# Patient Record
Sex: Female | Born: 1955 | Race: White | Hispanic: No | Marital: Married | Smoking: Never smoker
Health system: Southern US, Community
[De-identification: ages and names within clinical notes are randomized; demographics above are authoritative.]

## PROBLEM LIST (undated history)

## (undated) DIAGNOSIS — Z789 Other specified health status: Secondary | ICD-10-CM

## (undated) DIAGNOSIS — M810 Age-related osteoporosis without current pathological fracture: Secondary | ICD-10-CM

## (undated) DIAGNOSIS — I10 Essential (primary) hypertension: Secondary | ICD-10-CM

## (undated) HISTORY — PX: NO PAST SURGERIES: SHX2092

## (undated) HISTORY — PX: TUBAL LIGATION: SHX77

## (undated) HISTORY — DX: Other specified health status: Z78.9

---

## 2006-06-20 ENCOUNTER — Ambulatory Visit: Payer: Self-pay | Admitting: Family Medicine

## 2006-06-20 DIAGNOSIS — H539 Unspecified visual disturbance: Secondary | ICD-10-CM | POA: Insufficient documentation

## 2006-06-20 DIAGNOSIS — R209 Unspecified disturbances of skin sensation: Secondary | ICD-10-CM | POA: Insufficient documentation

## 2006-06-22 ENCOUNTER — Encounter: Payer: Self-pay | Admitting: Family Medicine

## 2006-07-08 ENCOUNTER — Ambulatory Visit: Payer: Self-pay | Admitting: Family Medicine

## 2006-07-08 DIAGNOSIS — G43809 Other migraine, not intractable, without status migrainosus: Secondary | ICD-10-CM | POA: Insufficient documentation

## 2006-07-08 DIAGNOSIS — M722 Plantar fascial fibromatosis: Secondary | ICD-10-CM

## 2006-07-25 ENCOUNTER — Encounter: Payer: Self-pay | Admitting: Family Medicine

## 2006-07-25 DIAGNOSIS — M899 Disorder of bone, unspecified: Secondary | ICD-10-CM | POA: Insufficient documentation

## 2006-07-25 DIAGNOSIS — M949 Disorder of cartilage, unspecified: Secondary | ICD-10-CM

## 2006-10-06 ENCOUNTER — Ambulatory Visit: Payer: Self-pay | Admitting: Family Medicine

## 2006-10-17 ENCOUNTER — Encounter: Admission: RE | Admit: 2006-10-17 | Discharge: 2006-10-17 | Payer: Self-pay | Admitting: Family Medicine

## 2006-10-18 ENCOUNTER — Telehealth (INDEPENDENT_AMBULATORY_CARE_PROVIDER_SITE_OTHER): Payer: Self-pay | Admitting: *Deleted

## 2006-11-09 ENCOUNTER — Encounter: Payer: Self-pay | Admitting: Family Medicine

## 2007-10-23 ENCOUNTER — Ambulatory Visit: Payer: Self-pay | Admitting: Family Medicine

## 2007-10-23 DIAGNOSIS — R42 Dizziness and giddiness: Secondary | ICD-10-CM

## 2018-06-13 ENCOUNTER — Encounter: Payer: Self-pay | Admitting: Sports Medicine

## 2018-06-13 ENCOUNTER — Ambulatory Visit (INDEPENDENT_AMBULATORY_CARE_PROVIDER_SITE_OTHER): Payer: 59

## 2018-06-13 ENCOUNTER — Ambulatory Visit (INDEPENDENT_AMBULATORY_CARE_PROVIDER_SITE_OTHER): Payer: 59 | Admitting: Sports Medicine

## 2018-06-13 DIAGNOSIS — M7061 Trochanteric bursitis, right hip: Secondary | ICD-10-CM

## 2018-06-13 DIAGNOSIS — G8929 Other chronic pain: Secondary | ICD-10-CM | POA: Diagnosis not present

## 2018-06-13 DIAGNOSIS — M79672 Pain in left foot: Secondary | ICD-10-CM

## 2018-06-13 DIAGNOSIS — M25561 Pain in right knee: Secondary | ICD-10-CM

## 2018-06-13 MED ORDER — MELOXICAM 15 MG PO TABS: ORAL_TABLET | ORAL | 3 refills | Status: DC

## 2018-06-13 NOTE — Assessment & Plan Note (Signed)
I do suspect a third metatarsal neck stress injury. I am going to refer her out for custom orthotics.

## 2018-06-13 NOTE — Progress Notes (Signed)
Subjective:    CC: Multiple pains  HPI:  Hip pain: Right-sided, present several months, localized over the greater trochanter.  Localized without radiation, no trauma.  Left foot pain: Dorsal forefoot pain, localized specifically over the third metatarsal neck.  No trauma.  Present for years.  He does have some three-quarter length rigid plastic orthotics.  Right knee pain: Medial joint line, moderate, persistent, no mechanical symptoms, no trauma, localized without radiation.  I reviewed the past medical history, family history, social history, surgical history, and allergies today and no changes were needed.  Please see the problem list section below in epic for further details.  Past Medical History: Past Medical History:  Diagnosis Date  . No pertinent past medical history    Past Surgical History: Past Surgical History:  Procedure Laterality Date  . NO PAST SURGERIES     Social History: Social History   Socioeconomic History  . Marital status: Married    Spouse name: Not on file  . Number of children: Not on file  . Years of education: Not on file  . Highest education level: Not on file  Occupational History  . Not on file  Social Needs  . Financial resource strain: Not on file  . Food insecurity:    Worry: Not on file    Inability: Not on file  . Transportation needs:    Medical: Not on file    Non-medical: Not on file  Tobacco Use  . Smoking status: Never Smoker  . Smokeless tobacco: Never Used  Substance and Sexual Activity  . Alcohol use: Never    Frequency: Never  . Drug use: Never  . Sexual activity: Not on file  Lifestyle  . Physical activity:    Days per week: Not on file    Minutes per session: Not on file  . Stress: Not on file  Relationships  . Social connections:    Talks on phone: Not on file    Gets together: Not on file    Attends religious service: Not on file    Active member of club or organization: Not on file    Attends meetings  of clubs or organizations: Not on file    Relationship status: Not on file  Other Topics Concern  . Not on file  Social History Narrative  . Not on file   Family History: Family History  Problem Relation Age of Onset  . Cancer Maternal Grandfather        colon   Allergies: No Known Allergies Medications: See med rec.  Review of Systems: No headache, visual changes, nausea, vomiting, diarrhea, constipation, dizziness, abdominal pain, skin rash, fevers, chills, night sweats, swollen lymph nodes, weight loss, chest pain, body aches, joint swelling, muscle aches, shortness of breath, mood changes, visual or auditory hallucinations.  Objective:    General: Well Developed, well nourished, and in no acute distress.  Neuro: Alert and oriented x3, extra-ocular muscles intact, sensation grossly intact.  HEENT: Normocephalic, atraumatic, pupils equal round reactive to light, neck supple, no masses, no lymphadenopathy, thyroid nonpalpable.  Skin: Warm and dry, no rashes noted.  Cardiac: Regular rate and rhythm, no murmurs rubs or gallops.  Respiratory: Clear to auscultation bilaterally. Not using accessory muscles, speaking in full sentences.  Abdominal: Soft, nontender, nondistended, positive bowel sounds, no masses, no organomegaly.  Right hip: ROM IR: 60 Deg, ER: 60 Deg, Flexion: 120 Deg, Extension: 100 Deg, Abduction: 45 Deg, Adduction: 45 Deg Strength IR: 5/5, ER: 5/5, Flexion: 5/5,  Extension: 5/5, Abduction: 5/5, Adduction: 5/5 Pelvic alignment unremarkable to inspection and palpation. Standing hip rotation and gait without trendelenburg / unsteadiness. Greater trochanter with mild tenderness to palpation. No tenderness over piriformis. No SI joint tenderness and normal minimal SI movement. Right knee: Normal to inspection with no erythema or effusion or obvious bony abnormalities. Palpation normal with no warmth or joint line tenderness or patellar tenderness or condyle  tenderness. ROM normal in flexion and extension and lower leg rotation. Ligaments with solid consistent endpoints including ACL, PCL, LCL, MCL. Negative Mcmurray's and provocative meniscal tests. Non painful patellar compression. Patellar and quadriceps tendons unremarkable. Hamstring and quadriceps strength is normal. Left foot: No visible erythema or swelling. Range of motion is full in all directions. Strength is 5/5 in all directions. No hallux valgus. Pes cavus No abnormal callus noted. No pain over the navicular prominence, or base of fifth metatarsal. No tenderness to palpation of the calcaneal insertion of plantar fascia. No pain at the Achilles insertion. No pain over the calcaneal bursa. No pain of the retrocalcaneal bursa. Mild tenderness over the third metatarsal neck No hallux rigidus or limitus. No tenderness palpation over interphalangeal joints. No pain with compression of the metatarsal heads. Neurovascularly intact distally.  Impression and Recommendations:    The patient was counselled, risk factors were discussed, anticipatory guidance given.  Right knee pain Suspect early osteoarthritis. Meloxicam, x-rays, rehab exercises given. Return to see me in a month.  Greater trochanteric bursitis of right hip Rehab exercises given, meloxicam, x-rays.  Chronic foot pain, left I do suspect a third metatarsal neck stress injury. I am going to refer her out for custom orthotics. ___________________________________________ Ihor Austin. Benjamin Stain, M.D., ABFM., CAQSM. Primary Care and Sports Medicine [facility]  Adjunct Professor of Family Medicine  [employer]

## 2018-06-13 NOTE — Assessment & Plan Note (Signed)
Rehab exercises given, meloxicam, x-rays.

## 2018-06-13 NOTE — Assessment & Plan Note (Signed)
Suspect early osteoarthritis. Meloxicam, x-rays, rehab exercises given. Return to see me in a month.

## 2018-07-13 ENCOUNTER — Ambulatory Visit: Payer: 59 | Admitting: Sports Medicine

## 2018-09-26 ENCOUNTER — Other Ambulatory Visit: Payer: Self-pay

## 2018-09-26 ENCOUNTER — Ambulatory Visit (INDEPENDENT_AMBULATORY_CARE_PROVIDER_SITE_OTHER): Payer: 59 | Admitting: Sports Medicine

## 2018-09-26 DIAGNOSIS — M79672 Pain in left foot: Secondary | ICD-10-CM | POA: Diagnosis not present

## 2018-09-26 DIAGNOSIS — M7061 Trochanteric bursitis, right hip: Secondary | ICD-10-CM

## 2018-09-26 DIAGNOSIS — G8929 Other chronic pain: Secondary | ICD-10-CM

## 2018-09-26 DIAGNOSIS — M25561 Pain in right knee: Secondary | ICD-10-CM

## 2018-09-26 DIAGNOSIS — I10 Essential (primary) hypertension: Secondary | ICD-10-CM

## 2018-09-26 DIAGNOSIS — M722 Plantar fascial fibromatosis: Secondary | ICD-10-CM

## 2018-09-26 DIAGNOSIS — M7711 Lateral epicondylitis, right elbow: Secondary | ICD-10-CM

## 2018-09-26 MED ORDER — MELOXICAM 15 MG PO TABS: ORAL_TABLET | ORAL | 3 refills | Status: DC

## 2018-09-26 NOTE — Assessment & Plan Note (Signed)
Needs to get more diligent with her rehab exercises.

## 2018-09-26 NOTE — Assessment & Plan Note (Signed)
Still awaiting her full-length custom orthotics. Adding rehab exercises, meloxicam. Return in 1 month, injection if no better.

## 2018-09-26 NOTE — Progress Notes (Signed)
Subjective:    CC: Multiple issues  HPI: Knee pain: Suspected mild osteoarthritis, never took her meloxicam, still having a bit of discomfort.  Right elbow pain: Localized over the lateral epicondyle, worse with gripping motions, never took the anti-inflammatory, moderate, persistent, localized without radiation.  Right heel pain: Worse on the plantar aspect of the heel, and with the first few steps in the morning.  Hip pain: Trochanteric bursitis, never took the meloxicam.  I reviewed the past medical history, family history, social history, surgical history, and allergies today and no changes were needed.  Please see the problem list section below in epic for further details.  Past Medical History: Past Medical History:  Diagnosis Date  . No pertinent past medical history    Past Surgical History: Past Surgical History:  Procedure Laterality Date  . NO PAST SURGERIES     Social History: Social History   Socioeconomic History  . Marital status: Married    Spouse name: Not on file  . Number of children: Not on file  . Years of education: Not on file  . Highest education level: Not on file  Occupational History  . Not on file  Social Needs  . Financial resource strain: Not on file  . Food insecurity:    Worry: Not on file    Inability: Not on file  . Transportation needs:    Medical: Not on file    Non-medical: Not on file  Tobacco Use  . Smoking status: Never Smoker  . Smokeless tobacco: Never Used  Substance and Sexual Activity  . Alcohol use: Never    Frequency: Never  . Drug use: Never  . Sexual activity: Not on file  Lifestyle  . Physical activity:    Days per week: Not on file    Minutes per session: Not on file  . Stress: Not on file  Relationships  . Social connections:    Talks on phone: Not on file    Gets together: Not on file    Attends religious service: Not on file    Active member of club or organization: Not on file    Attends meetings  of clubs or organizations: Not on file    Relationship status: Not on file  Other Topics Concern  . Not on file  Social History Narrative  . Not on file   Family History: Family History  Problem Relation Age of Onset  . Cancer Maternal Grandfather        colon   Allergies: No Known Allergies Medications: See med rec.  Review of Systems: No fevers, chills, night sweats, weight loss, chest pain, or shortness of breath.   Objective:    General: Well Developed, well nourished, and in no acute distress.  Neuro: Alert and oriented x3, extra-ocular muscles intact, sensation grossly intact.  HEENT: Normocephalic, atraumatic, pupils equal round reactive to light, neck supple, no masses, no lymphadenopathy, thyroid nonpalpable.  Skin: Warm and dry, no rashes. Cardiac: Regular rate and rhythm, no murmurs rubs or gallops, no lower extremity edema.  Respiratory: Clear to auscultation bilaterally. Not using accessory muscles, speaking in full sentences. Right elbow: Unremarkable to inspection. Range of motion full pronation, supination, flexion, extension. Strength is full to all of the above directions Stable to varus, valgus stress. Negative moving valgus stress test. Tender to palpation of the common extensor tendon origin, reproduction of pain with resisted extension of the middle finger, and resisted extension of the wrist. Ulnar nerve does not sublux. Negative  cubital tunnel Tinel's. Right foot: No visible erythema or swelling. Range of motion is full in all directions. Strength is 5/5 in all directions. No hallux valgus. No pes cavus or pes planus. No abnormal callus noted. No pain over the navicular prominence, or base of fifth metatarsal. Moderate tenderness to palpation of the calcaneal insertion of plantar fascia. No pain at the Achilles insertion. No pain over the calcaneal bursa. No pain of the retrocalcaneal bursa. No tenderness to palpation over the tarsals,  metatarsals, or phalanges. No hallux rigidus or limitus. No tenderness palpation over interphalangeal joints. No pain with compression of the metatarsal heads. Neurovascularly intact distally.  Impression and Recommendations:    Plantar fasciitis, right Still awaiting her full-length custom orthotics. Adding rehab exercises, meloxicam. Return in 1 month, injection if no better.  Chronic foot pain, left Suspected third metatarsal stress injury, adding meloxicam. Still awaiting her custom molded orthotics.  Greater trochanteric bursitis of right hip Needs to get more diligent with her rehab exercises.  Right knee pain Mild early osteoarthritis, needs to get a bit more diligent with her rehab exercises and actually take the meloxicam this time.  Benign essential hypertension Starting with dietary modifications, she will cut back on her carbohydrates, as well as cut back significantly on her sodium intake. Monitor her blood pressure at home and establish care with 1 of my partners.  Right lateral epicondylitis Adding rehab exercises, meloxicam. Return 1 month, injection if no better.   ___________________________________________ Ihor Austin. Benjamin Stain, M.D., ABFM., CAQSM. Primary Care and Sports Medicine [facility]  Adjunct Professor of Family Medicine  [employer]

## 2018-09-26 NOTE — Assessment & Plan Note (Signed)
Starting with dietary modifications, she will cut back on her carbohydrates, as well as cut back significantly on her sodium intake. Monitor her blood pressure at home and establish care with 1 of my partners.

## 2018-09-26 NOTE — Assessment & Plan Note (Signed)
Suspected third metatarsal stress injury, adding meloxicam. Still awaiting her custom molded orthotics.

## 2018-09-26 NOTE — Assessment & Plan Note (Signed)
Adding rehab exercises, meloxicam. Return 1 month, injection if no better.

## 2018-09-26 NOTE — Assessment & Plan Note (Signed)
Mild early osteoarthritis, needs to get a bit more diligent with her rehab exercises and actually take the meloxicam this time.

## 2018-10-26 ENCOUNTER — Ambulatory Visit: Payer: 59 | Admitting: Sports Medicine

## 2018-11-01 ENCOUNTER — Ambulatory Visit: Payer: 59 | Admitting: Sports Medicine

## 2019-02-14 ENCOUNTER — Ambulatory Visit: Payer: Self-pay | Admitting: Podiatry

## 2020-10-13 IMAGING — DX DG HIP (WITH OR WITHOUT PELVIS) 2-3V*R*
3 series · 3 of 3 positions shown · non-contrast
Comparison: None.

CLINICAL DATA: Greater trochanter bursitis right hip

EXAM:
DG HIP (WITH OR WITHOUT PELVIS) 2-3V RIGHT

[pelvis ap]
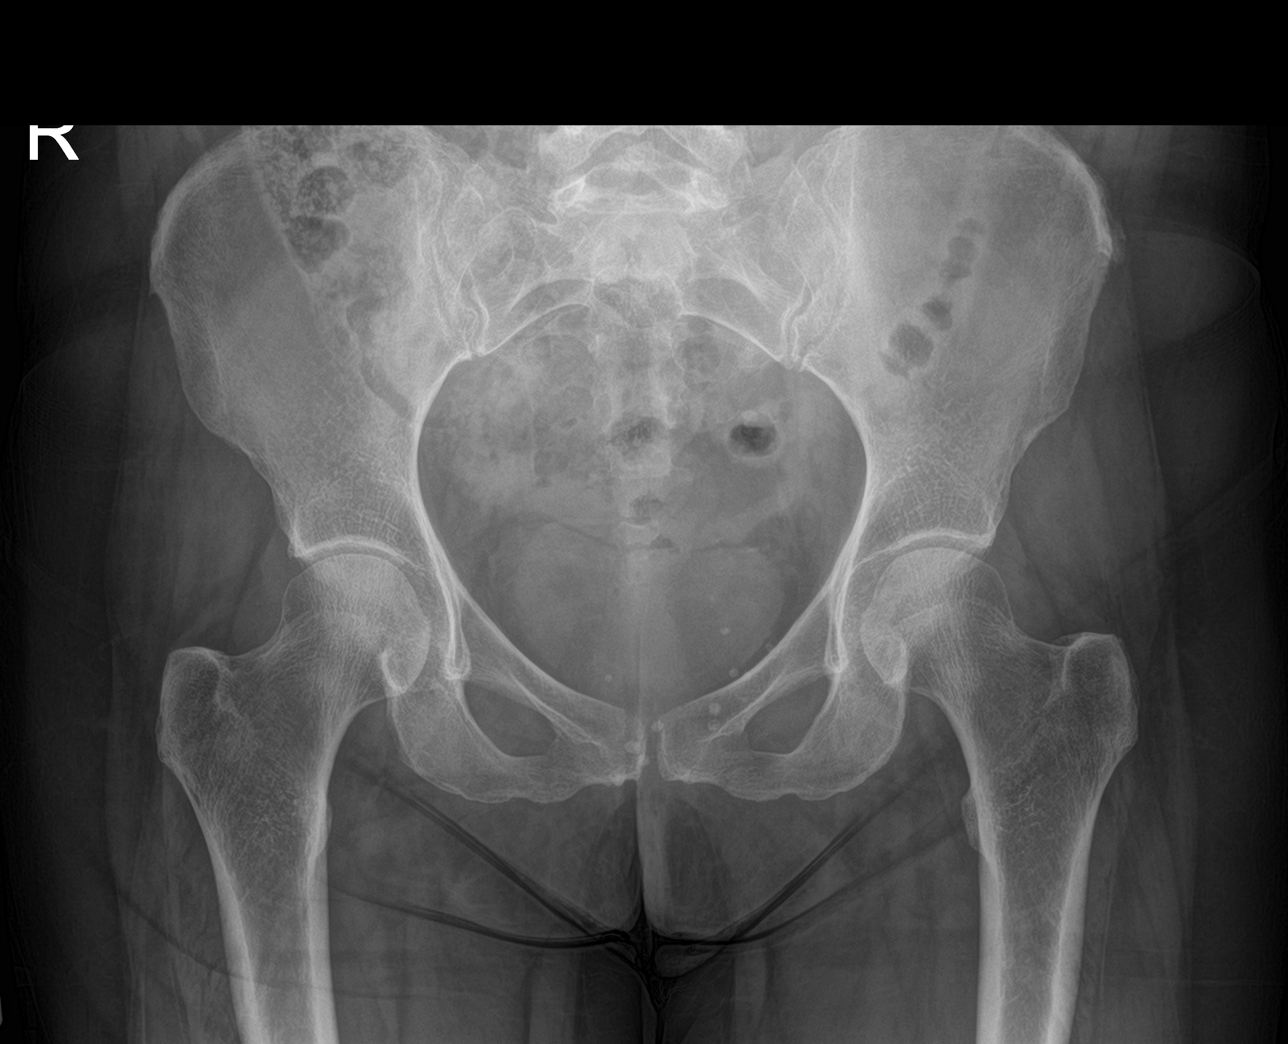

[hip ap]
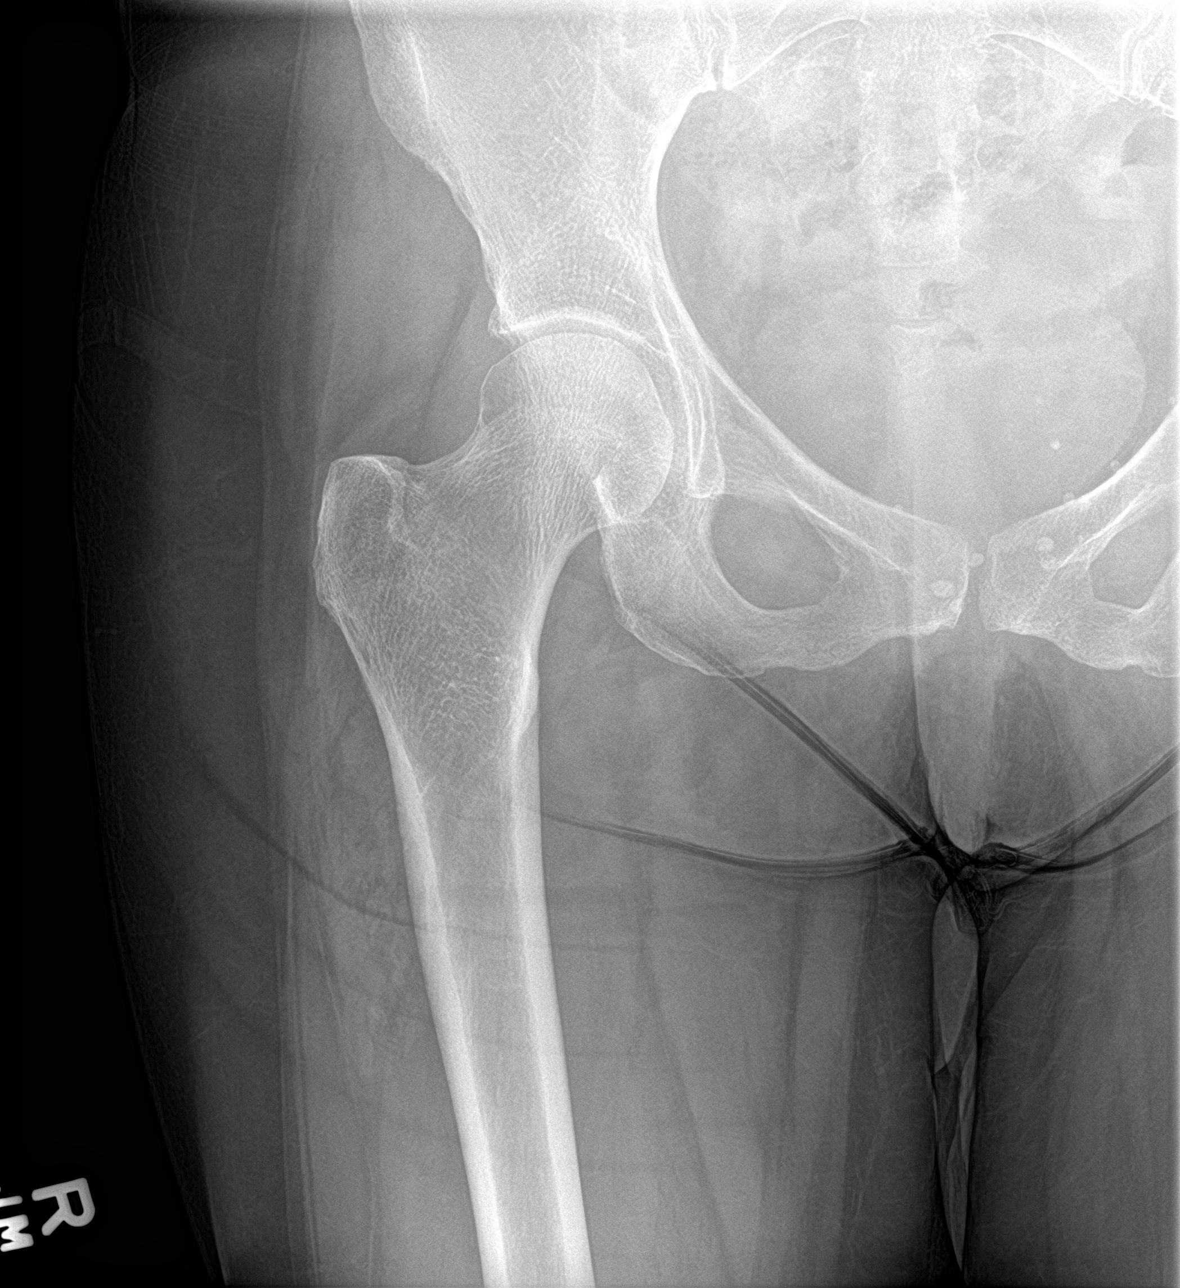

[hip frog leg]
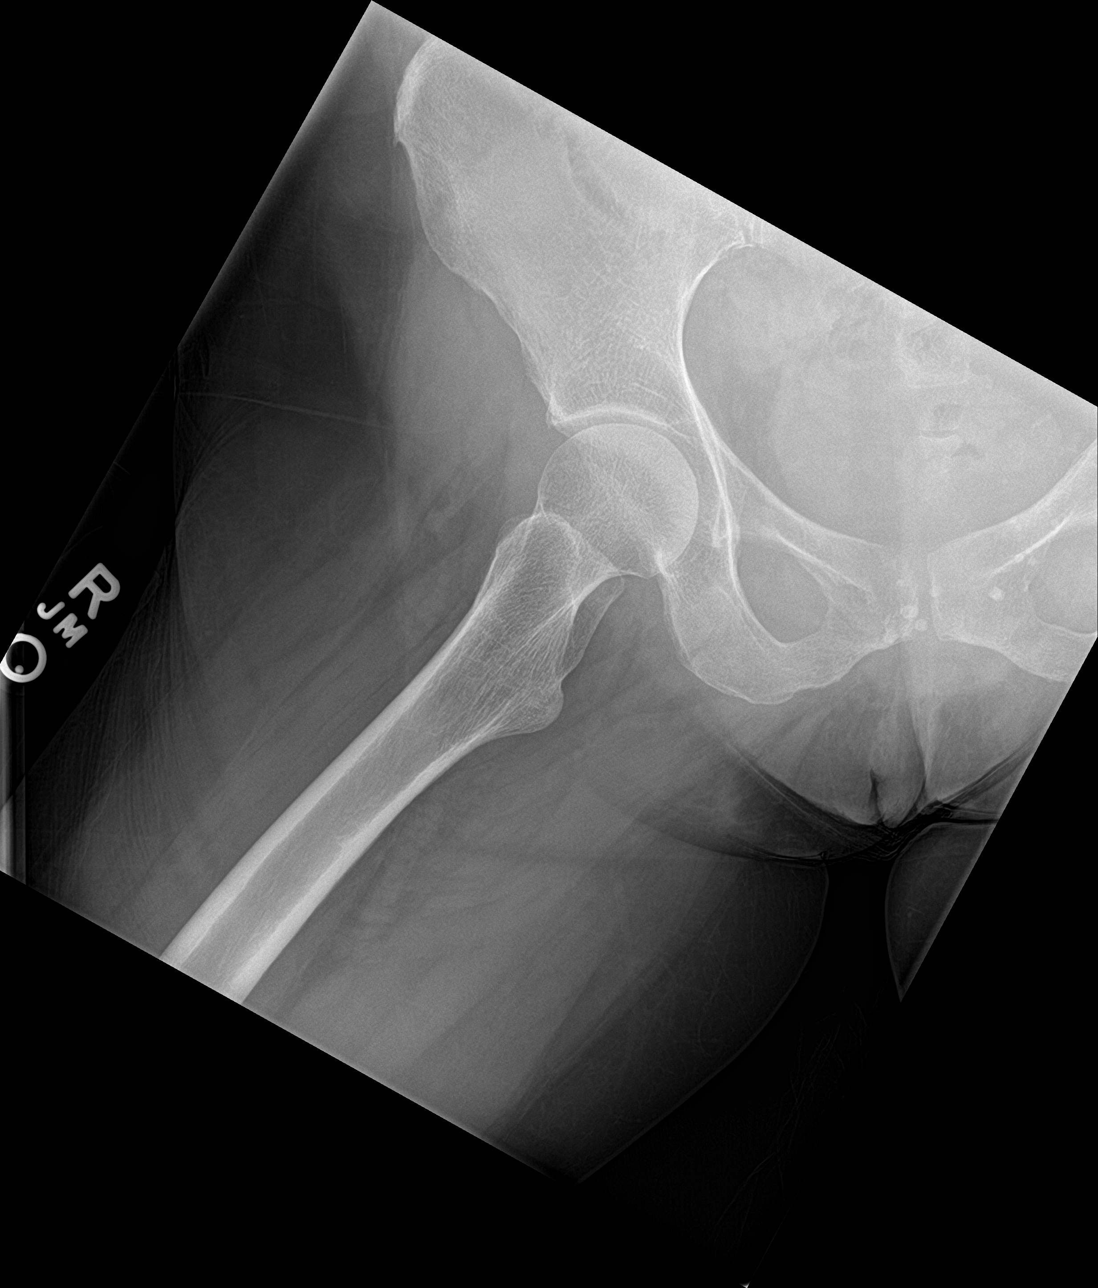

[3 of 3 positions shown; findings below may reference images not displayed]

FINDINGS: There is no evidence of hip fracture or dislocation. There is no
evidence of arthropathy or other focal bone abnormality. Greater
trochanter appears normal on the right. No soft tissue
calcification.
IMPRESSION: Negative.

## 2020-10-13 IMAGING — DX DG FOOT COMPLETE 3+V*L*
3 series · 3 of 3 positions shown · non-contrast
Comparison: 10/17/2006

CLINICAL DATA: Chronic left foot pain.  Pain third metatarsal

EXAM:
LEFT FOOT - COMPLETE 3+ VIEW

[foot ap]
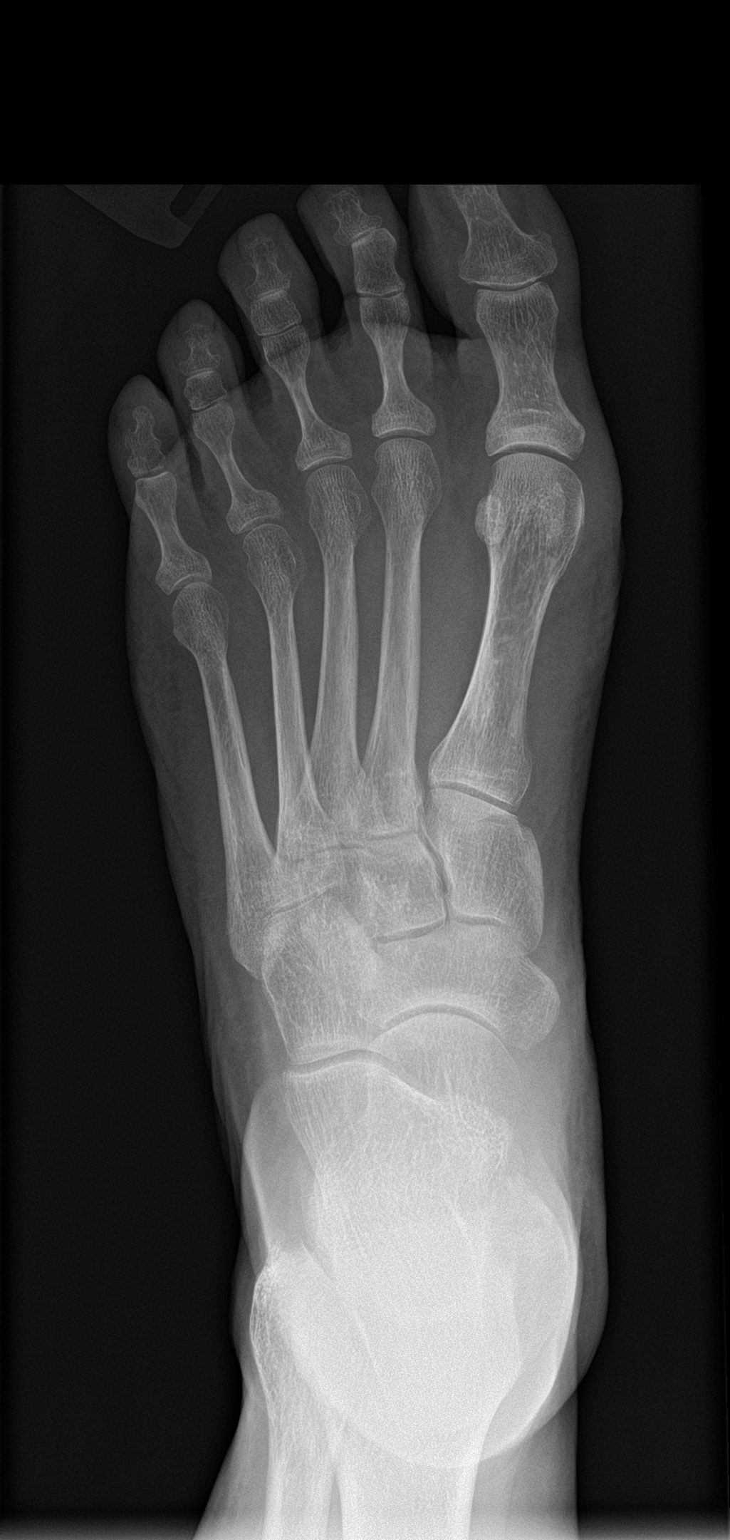

[foot obl]
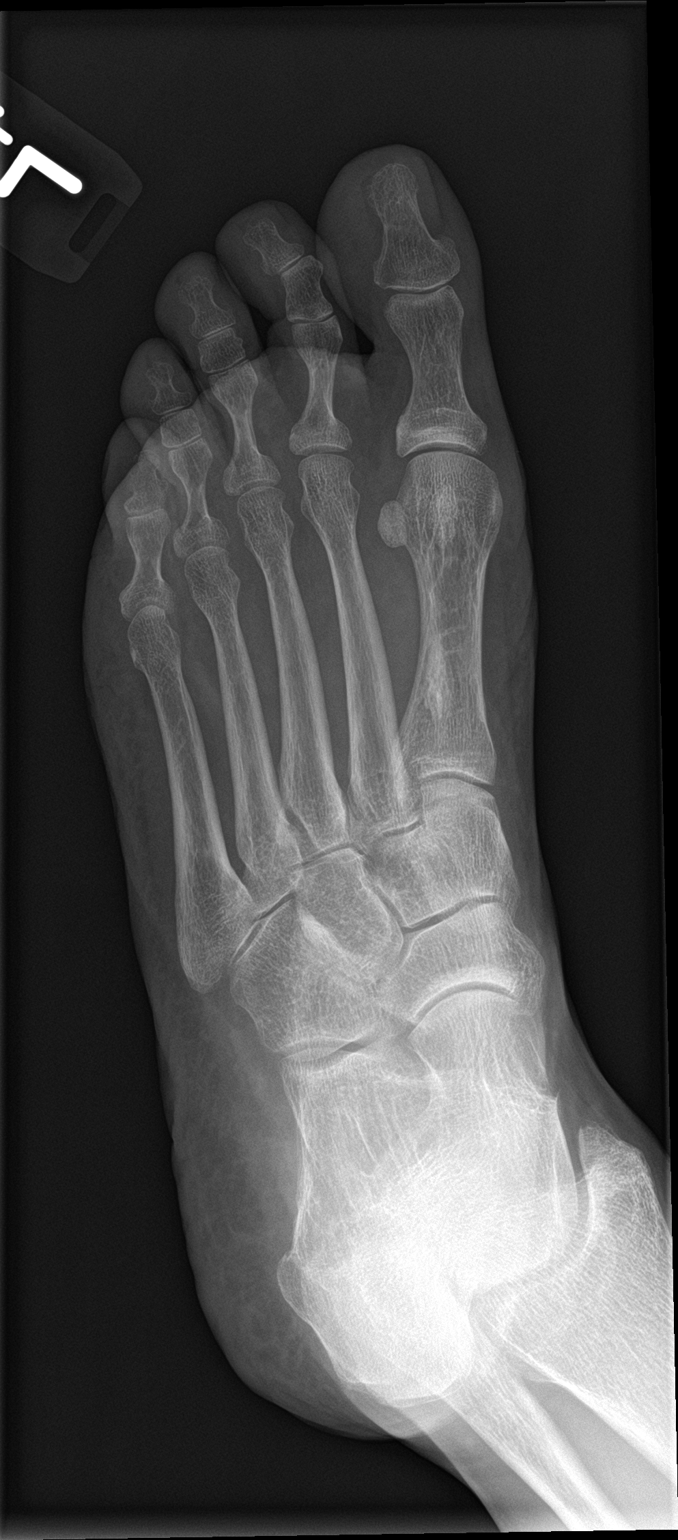

[foot lat]
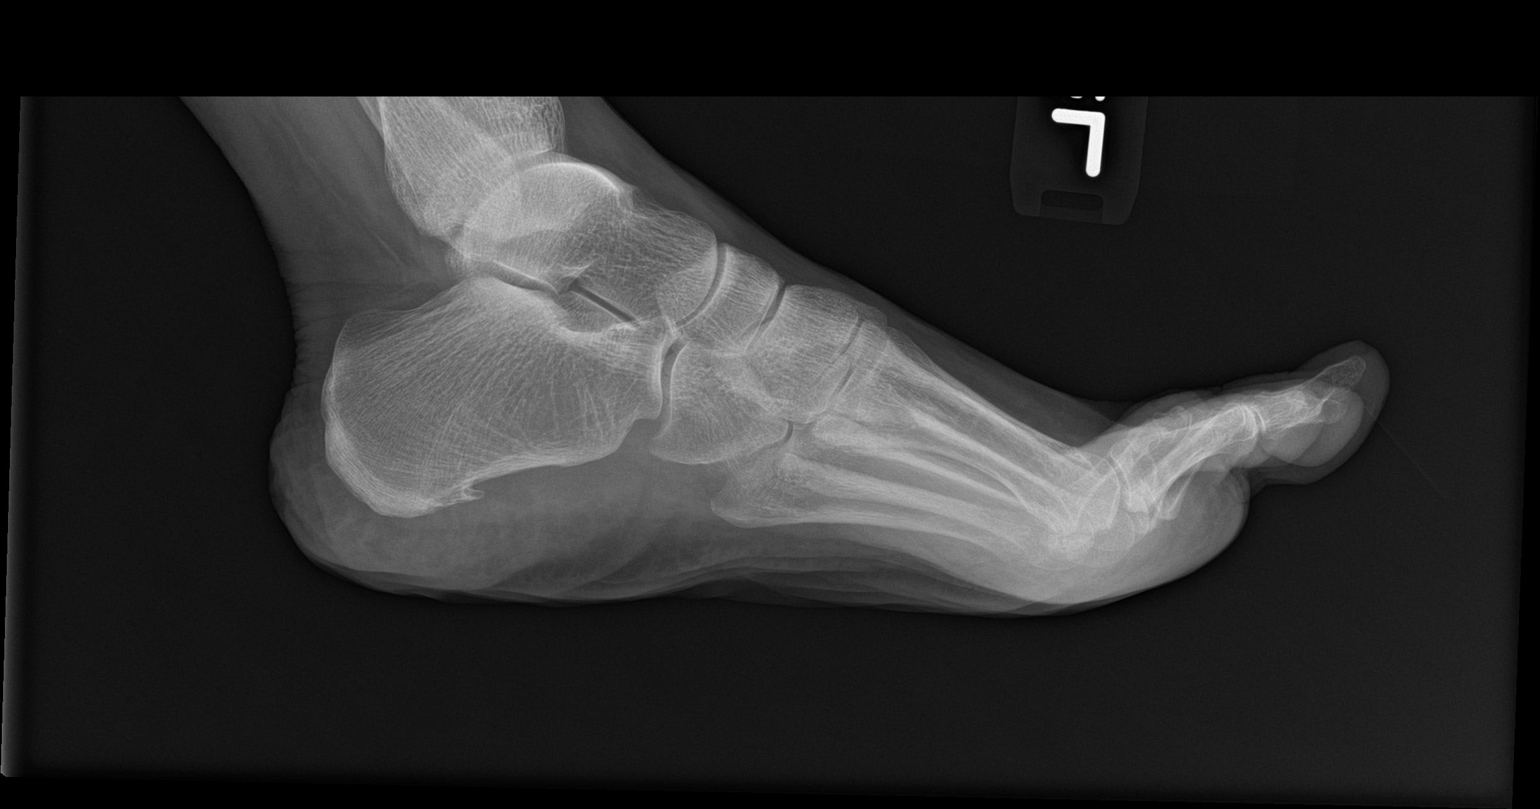

[3 of 3 positions shown; findings below may reference images not displayed]

FINDINGS: There is no evidence of fracture or dislocation. There is no
evidence of arthropathy or other focal bone abnormality. Soft
tissues are unremarkable.
IMPRESSION: Negative.

## 2023-10-19 ENCOUNTER — Other Ambulatory Visit: Payer: Self-pay

## 2023-10-19 ENCOUNTER — Ambulatory Visit
Admission: EM | Admit: 2023-10-19 | Discharge: 2023-10-19 | Disposition: A | Discharge status: Home / Self Care | Attending: Family Medicine | Admitting: Family Medicine

## 2023-10-19 DIAGNOSIS — W57XXXA Bitten or stung by nonvenomous insect and other nonvenomous arthropods, initial encounter: Secondary | ICD-10-CM | POA: Diagnosis not present

## 2023-10-19 DIAGNOSIS — S40862A Insect bite (nonvenomous) of left upper arm, initial encounter: Secondary | ICD-10-CM | POA: Diagnosis not present

## 2023-10-19 DIAGNOSIS — Z9189 Other specified personal risk factors, not elsewhere classified: Secondary | ICD-10-CM | POA: Diagnosis not present

## 2023-10-19 HISTORY — DX: Essential (primary) hypertension: I10

## 2023-10-19 HISTORY — DX: Age-related osteoporosis without current pathological fracture: M81.0

## 2023-10-19 NOTE — ED Triage Notes (Signed)
 Tick bite to left shoulder x 2 weeks. No itching or hurting. No ha or fevers.

## 2023-10-19 NOTE — ED Provider Notes (Signed)
 [facility]    CSN: [id number] Arrival date & time: 10/19/23  1101      History   Chief Complaint Chief Complaint  Patient presents with   Tick Removal    HPI Stephanie Chang is a 68 y.o. female.   HPI Patient found a tick on her left shoulder approximately 2 weeks ago.  Her husband removed it.  This is small reddened area that remains.  Patient has a trip to [place] scheduled later in the month and wants information about possible Lyme disease.  She has no fever or chills.  No headache or body ache.  No joint pain.  She did not have any redness at the site of the bite and does not describe erythema migrans Past Medical History:  Diagnosis Date   Hypertension    Osteoporosis     Patient Active Problem List   Diagnosis Date Noted   Benign essential hypertension 09/26/2018   Right lateral epicondylitis 09/26/2018   Chronic foot pain, left 06/13/2018   Greater trochanteric bursitis of right hip 06/13/2018   Right knee pain 06/13/2018   DIZZINESS 10/23/2007   OSTEOPENIA 07/25/2006   MIGRAINE VARIANT 07/08/2006   Plantar fasciitis, right 07/08/2006   DISTURBANCE, VISUAL NOS 06/20/2006   ARM NUMBNESS 06/20/2006    Past Surgical History:  Procedure Laterality Date   TUBAL LIGATION      OB History   No obstetric history on file.      Home Medications    Prior to Admission medications   Medication Sig Start Date End Date Taking? Authorizing Provider  alendronate (FOSAMAX) 70 MG tablet Take 70 mg by mouth once a week. Take with a full glass of water on an empty stomach.   Yes [provider]  amLODipine (NORVASC) 10 MG tablet Take 10 mg by mouth daily.   Yes [provider]  cholecalciferol (VITAMIN D3) 25 MCG (1000 UNIT) tablet Take 1,000 Units by mouth daily.   Yes [provider]  hydrochlorothiazide (HYDRODIURIL) 12.5 MG tablet Take 12.5 mg by mouth daily.   Yes [provider]  losartan (COZAAR) 25 MG tablet Take 25  mg by mouth daily.   Yes [provider]    Family History Family History  Problem Relation Age of Onset   Cancer Maternal Grandfather        colon    Social History Social History   Tobacco Use   Smoking status: Never   Smokeless tobacco: Never  Substance Use Topics   Alcohol use: Never   Drug use: Never     Allergies   Patient has no known allergies.   Review of Systems Review of Systems See HPI  Physical Exam Triage Vital Signs ED Triage Vitals  Encounter Vitals Group     BP 10/19/23 1113 (!) 177/69     Girls Systolic BP Percentile --      Girls Diastolic BP Percentile --      Boys Systolic BP Percentile --      Boys Diastolic BP Percentile --      Pulse Rate 10/19/23 1113 67     Resp 10/19/23 1113 16     Temp 10/19/23 1113 98.1 F (36.7 C)     Temp src --      SpO2 10/19/23 1113 97 %     Weight --      Height --      Head Circumference --      Peak Flow --  Pain Score 10/19/23 1116 0     Pain Loc --      Pain Education --      Exclude from Growth Chart --    No data found.  Updated Vital Signs BP (!) 177/69   Pulse 67   Temp 98.1 F (36.7 C)   Resp 16   SpO2 97%        Physical Exam Constitutional:      General: She is not in acute distress.    Appearance: She is well-developed.  HENT:     Head: Normocephalic and atraumatic.   Eyes:     Conjunctiva/sclera: Conjunctivae normal.     Pupils: Pupils are equal, round, and reactive to light.    Cardiovascular:     Rate and Rhythm: Normal rate.  Pulmonary:     Effort: Pulmonary effort is normal. No respiratory distress.   Musculoskeletal:        General: Normal range of motion.     Cervical back: Normal range of motion.   Skin:    General: Skin is warm and dry.     Findings: Lesion present.     Comments: 4 mm erythematous nodule noted left shoulder.  No surrounding erythema   Neurological:     Mental Status: She is alert.      UC Treatments / Results   Labs (all labs ordered are listed, but only abnormal results are displayed) Labs Reviewed  LYME DISEASE SEROLOGY W/REFLEX    EKG   Radiology No results found.  Procedures Procedures (including critical care time)  Medications Ordered in UC Medications - No data to display  Initial Impression / Assessment and Plan / UC Course  I have reviewed the triage vital signs and the nursing notes.  Pertinent labs & imaging results that were available during my care of the patient were reviewed by me and considered in my medical decision making (see chart for details).     I explained to the patient I think it is unlikely she would have contracted Lyme disease from a tick that had only been attached for perhaps 1 or 2 days.  It was not engorged.  She is not having any symptoms.  Because of her concern, however, blood testing will be done. Final Clinical Impressions(s) / UC Diagnoses   Final diagnoses:  Tick bite of left upper arm, initial encounter  Risk of exposure to Lyme disease     Discharge Instructions      Check My Chart for test result You will be called if the result is positive   ED Prescriptions   None    PDMP not reviewed this encounter.   Maranda Jamee Jacob, MD 10/19/23 [phone]

## 2023-10-19 NOTE — Discharge Instructions (Signed)
 Check My Chart for test result You will be called if the result is positive

## 2023-10-20 ENCOUNTER — Ambulatory Visit: Payer: Self-pay | Admitting: Family Medicine

## 2023-10-20 LAB — LYME DISEASE SEROLOGY W/REFLEX: Lyme Total Antibody EIA: NEGATIVE
